# Patient Record
Sex: Female | Born: 2000 | Race: Black or African American | Hispanic: No | Marital: Single
Health system: Southern US, Community
[De-identification: ages and names within clinical notes are randomized; demographics above are authoritative.]

---

## 2019-12-12 ENCOUNTER — Emergency Department (HOSPITAL_COMMUNITY)
Admission: EM | Admit: 2019-12-12 | Discharge: 2019-12-13 | Disposition: A | Discharge status: Home / Self Care | Payer: Medicaid - Out of State | Attending: Emergency Medicine | Admitting: Emergency Medicine

## 2019-12-12 ENCOUNTER — Emergency Department (HOSPITAL_COMMUNITY): Payer: Medicaid - Out of State

## 2019-12-12 ENCOUNTER — Other Ambulatory Visit: Payer: Self-pay

## 2019-12-12 DIAGNOSIS — R0789 Other chest pain: Secondary | ICD-10-CM | POA: Insufficient documentation

## 2019-12-12 DIAGNOSIS — R079 Chest pain, unspecified: Secondary | ICD-10-CM | POA: Diagnosis present

## 2019-12-12 LAB — I-STAT BETA HCG BLOOD, ED (MC, WL, AP ONLY): I-stat hCG, quantitative: <5 m[IU]/mL (ref <5)

## 2019-12-12 LAB — I-STAT CHEM 8, ED
BUN: 19 mg/dL (ref 6–20)
Calcium, Ion: 1.17 mmol/L (ref 1.15–1.40)
Chloride: 101 mmol/L (ref 98–111)
Creatinine, Ser: 0.8 mg/dL (ref 0.44–1.00)
Glucose, Bld: 82 mg/dL (ref 70–99)
HCT: 44 % (ref 36.0–46.0)
Hemoglobin: 15 g/dL (ref 12.0–15.0)
Potassium: 3.2 mmol/L — ABNORMAL LOW (ref 3.5–5.1)
Sodium: 139 mmol/L (ref 135–145)
TCO2: 25 mmol/L (ref 22–32)

## 2019-12-12 NOTE — ED Provider Notes (Signed)
[name]-EMERGENCY DEPT Provider Note   CSN: [id number] Arrival date & time: 12/12/19  2215     History Chief Complaint  Patient presents with  . Chest Pain    Kelly Browning is a 19 y.o. female.  19 year old female presents to the emergency department for evaluation of chest pain.  She states that she has been experiencing similar chest pain intermittently over many months.  Her pain recurred this evening.  It is present in her left chest and is sharp, nonradiating.  She notices symptoms more when lying flat as well as with movement and deep breathing.  Occasionally feels winded when her pain is present.  No fevers, recent viral illness, leg swelling, N/V, syncope, OCP use, recent surgeries or hospitalizations.  Denies drug use.  No medications taken PTA.  The history is provided by the patient. No language interpreter was used.  Chest Pain      No past medical history on file.  There are no problems to display for this patient.   ** The histories are not reviewed yet. Please review them in the "History" navigator section and refresh this SmartLink.   OB History   No obstetric history on file.     No family history on file.  Social History   Tobacco Use  . Smoking status: Not on file  Substance Use Topics  . Alcohol use: Not on file  . Drug use: Not on file    Home Medications Prior to Admission medications   Medication Sig Start Date End Date Taking? Authorizing Provider  ibuprofen (ADVIL) 600 MG tablet Take 1 tablet (600 mg total) by mouth 3 (three) times daily. 12/13/19   Antony Madura, PA-C    Allergies    Patient has no known allergies.  Review of Systems   Review of Systems  Cardiovascular: Positive for chest pain.  Ten systems reviewed and are negative for acute change, except as noted in the HPI.    Physical Exam Updated Vital Signs BP (!) 155/93 (BP Location: Left Arm)   Pulse 86   Temp 99.4 F (37.4 C) (Oral)   Resp  18   Ht 5\' 1"  (1.549 m)   Wt 63.5 kg   LMP 12/12/2019   SpO2 100%   BMI 26.45 kg/m   Physical Exam Vitals and nursing note reviewed.  Constitutional:      General: She is not in acute distress.    Appearance: She is well-developed. She is not diaphoretic.     Comments: Nontoxic appearing and in NAD  HENT:     Head: Normocephalic and atraumatic.  Eyes:     General: No scleral icterus.    Conjunctiva/sclera: Conjunctivae normal.  Cardiovascular:     Rate and Rhythm: Normal rate and regular rhythm.     Pulses: Normal pulses.  Pulmonary:     Effort: Pulmonary effort is normal. No respiratory distress.     Breath sounds: No stridor. No wheezing or rales.     Comments: Lungs CTAB. Respirations even and unlabored. No TTP to chest wall. No crepitus. Musculoskeletal:        General: Normal range of motion.     Cervical back: Normal range of motion.     Comments: No BLE edema.  Skin:    General: Skin is warm and dry.     Coloration: Skin is not pale.     Findings: No erythema or rash.  Neurological:     General: No focal deficit present.  Mental Status: She is alert and oriented to person, place, and time.     Coordination: Coordination normal.  Psychiatric:        Behavior: Behavior normal.     ED Results / Procedures / Treatments   Labs (all labs ordered are listed, but only abnormal results are displayed) Labs Reviewed  I-STAT CHEM 8, ED - Abnormal; Notable for the following components:      Result Value   Potassium 3.2 (*)    All other components within normal limits  I-STAT BETA HCG BLOOD, ED ([facility], [facility], [facility] ONLY)  TROPONIN I (HIGH SENSITIVITY)    EKG ED ECG REPORT   Date: 12/13/2019  Rate: 74  Rhythm: sinus arrhythmia  QRS Axis: normal  Intervals: normal  ST/T Wave abnormalities: nonspecific ST changes  Conduction Disutrbances:none  Narrative Interpretation: Sinus arrhythmia with NSST changes V4-V6. No STEMI.  Old EKG Reviewed: none available  I have  personally reviewed the EKG tracing and agree with the computerized printout as noted.   Radiology DG Chest 2 View  Result Date: 12/12/2019 CLINICAL DATA:  Chest pain. EXAM: CHEST - 2 VIEW COMPARISON:  None. FINDINGS: The cardiomediastinal contours are normal. The lungs are clear. Pulmonary vasculature is normal. No consolidation, pleural effusion, or pneumothorax. Slight scoliotic curvature of the upper thoracic spine. No acute osseous abnormalities are seen. IMPRESSION: Other than scoliotic curvature of the upper thoracic spine, negative radiographs of the chest. Electronically Signed   By: Keith Rake M.D.   On: 12/12/2019 23:39    Procedures Procedures (including critical care time)  Medications Ordered in ED Medications - No data to display   ED Course  I have reviewed the triage vital signs and the nursing notes.  Pertinent labs & imaging results that were available during my care of the patient were reviewed by me and considered in my medical decision making (see chart for details).    MDM Rules/Calculators/A&P                      Patient presents to the emergency department for evaluation of chest pain.  Pain has been chronic, intermittent. EKG is reassuring and troponin negative.  Chest x-ray without evidence of mediastinal widening to suggest dissection.  No pneumothorax, pneumonia, pleural effusion.  Pulmonary embolus further considered; however, patient without tachycardia, tachypnea, dyspnea, hypoxia.  Patient is PERC negative.  Question of musculoskeletal etiology as pain is aggravated with breathing as well as movement.  While she reports some discomfort when lying flat, pain is not alleviated when leaning forward.  Her history and symptom chronicity is slightly atypical for pericarditis.  She has no diffuse ST elevation on EKG.  Still, will plan to manage as outpatient with consistent use of NSAIDs.  Patient encouraged to follow-up with her primary care doctor.   Return precautions discussed and provided. Patient discharged in stable condition with no unaddressed concerns.   Final Clinical Impression(s) / ED Diagnoses Final diagnoses:  Nonspecific chest pain    Rx / DC Orders ED Discharge Orders         Ordered    ibuprofen (ADVIL) 600 MG tablet  3 times daily     12/13/19 0051           Antonietta Breach, PA-C 12/13/19 0100    Valarie Merino, MD 12/15/19 1340

## 2019-12-12 NOTE — ED Notes (Signed)
EKG given to EDP,Rancour,MD., for review. 

## 2019-12-12 NOTE — ED Triage Notes (Signed)
Patient came to ED due to chest pain that started today has a history of murmur

## 2019-12-13 ENCOUNTER — Other Ambulatory Visit: Payer: Self-pay

## 2019-12-13 LAB — TROPONIN I (HIGH SENSITIVITY): Troponin I (High Sensitivity): 3 ng/L (ref <18)

## 2019-12-13 MED ORDER — IBUPROFEN 600 MG PO TABS: 600.0000 mg | ORAL_TABLET | Freq: Three times a day (TID) | ORAL | 0 refills | Status: AC

## 2019-12-13 NOTE — ED Notes (Signed)
EKG trouble shoot resolved, now showingin epic

## 2019-12-13 NOTE — Discharge Instructions (Addendum)
Your work-up in the emergency department tonight was reassuring and did not reveal a concerning cause of your chest pain.  We recommend follow-up with your primary care doctor for evaluation of ongoing discomfort.  You may return for any new or concerning symptoms.

## 2021-01-24 IMAGING — CR DG CHEST 2V
2 series · 2 of 2 positions shown · non-contrast
Comparison: None.

CLINICAL DATA: Chest pain.

EXAM:
CHEST - 2 VIEW

[w chest pa]
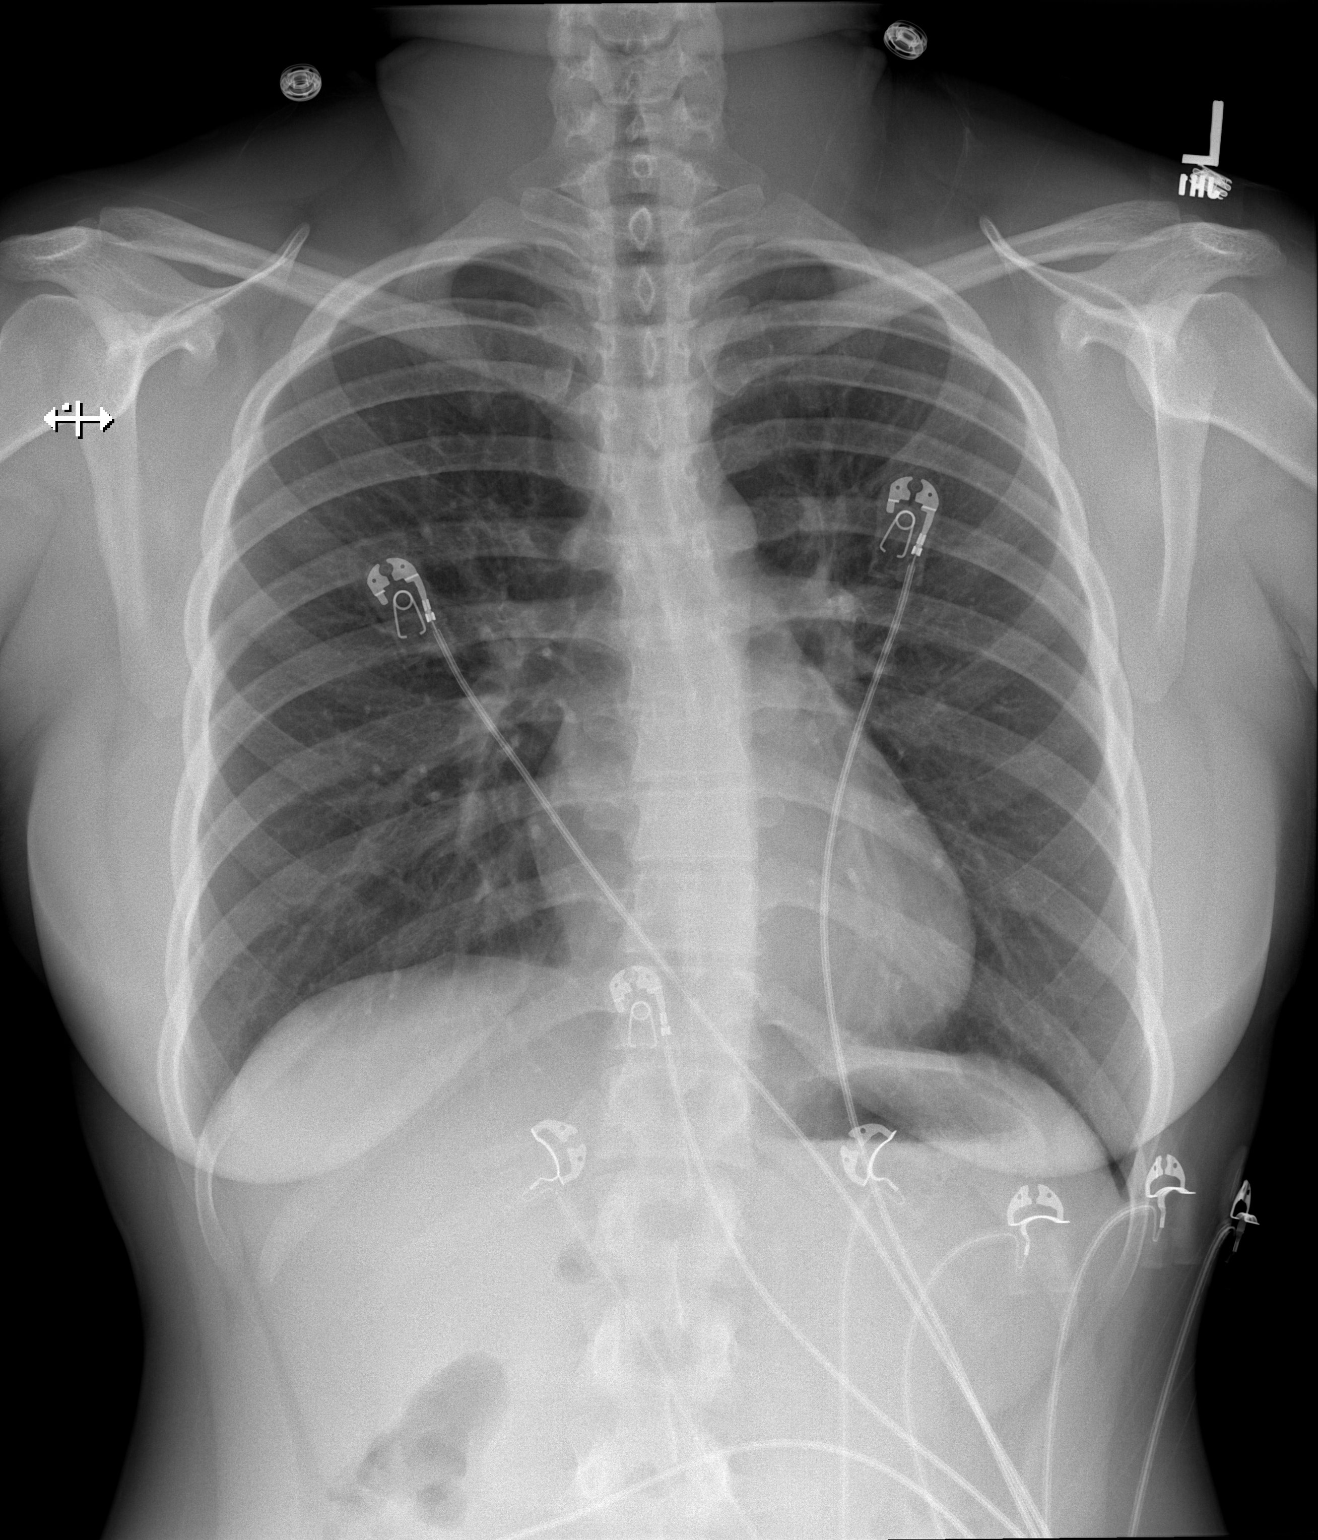

[w chest lat]
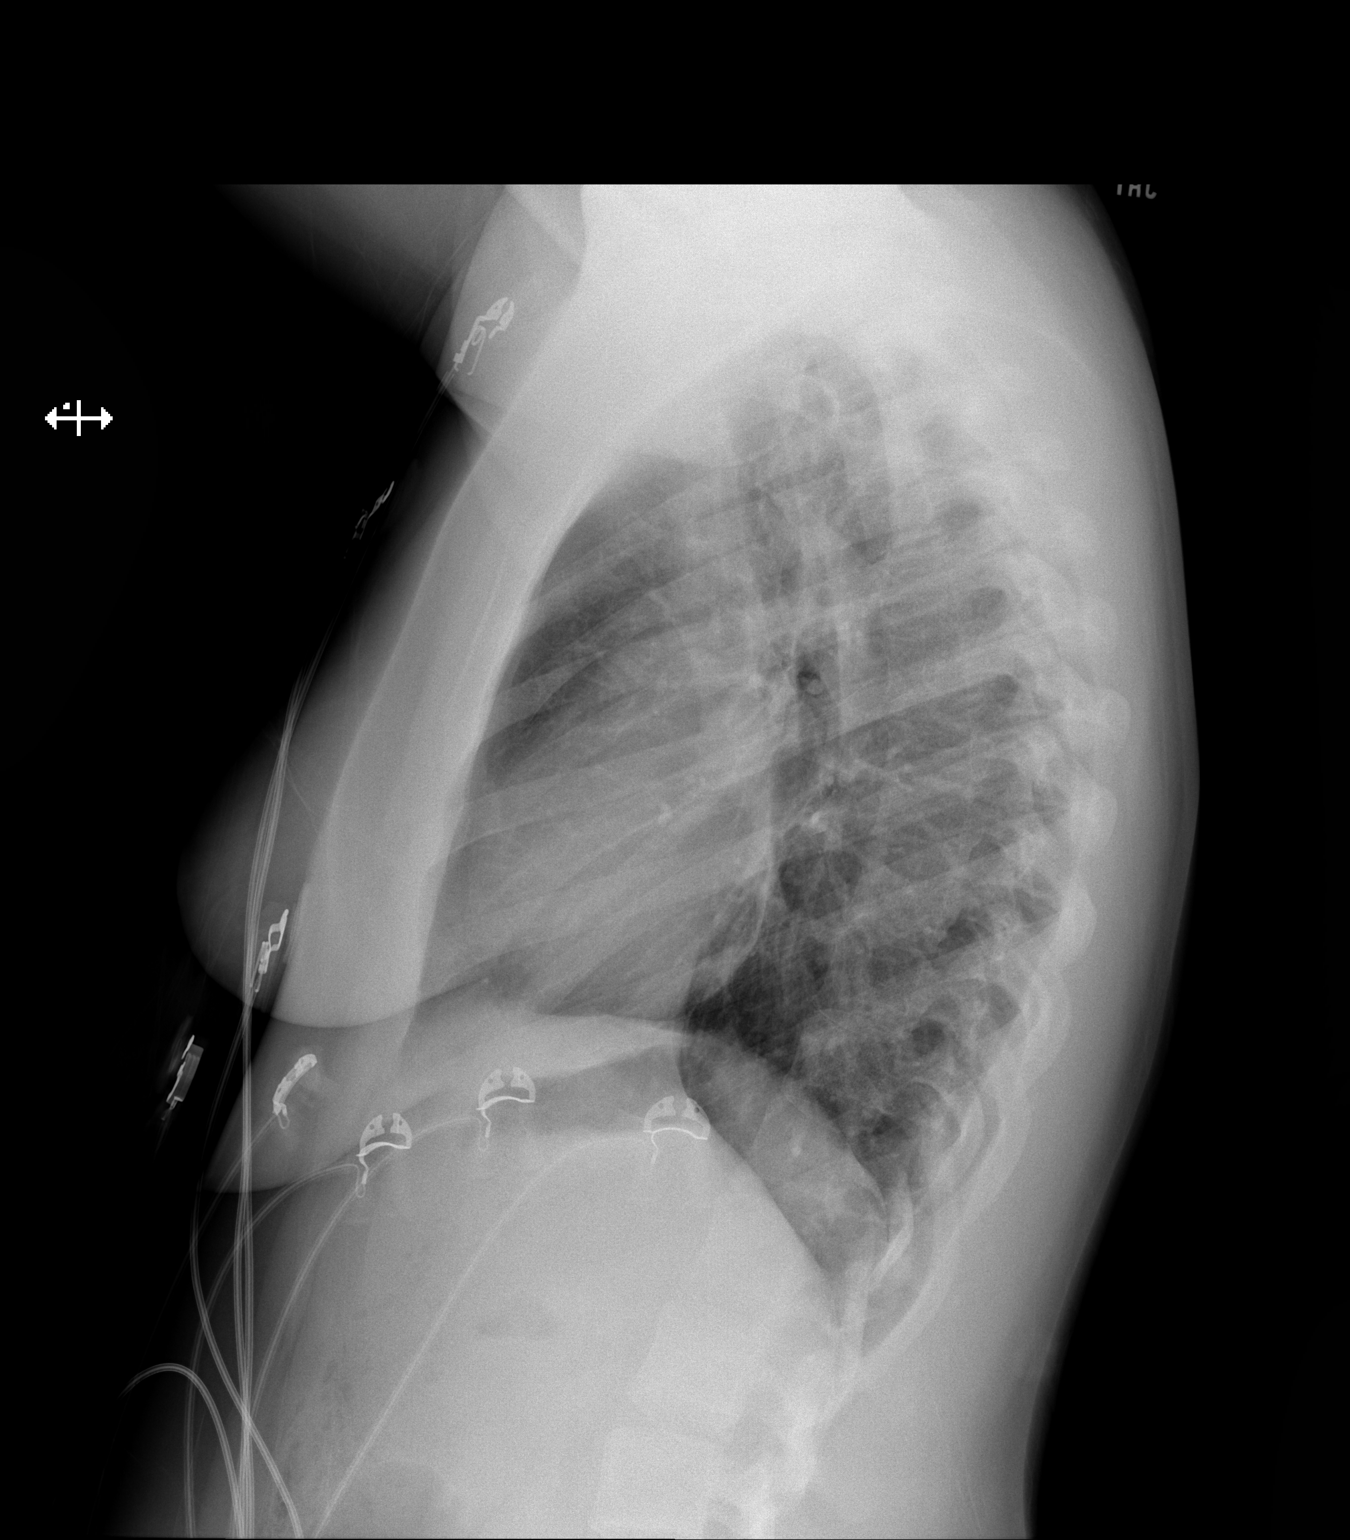

[2 of 2 positions shown; findings below may reference images not displayed]

FINDINGS: The cardiomediastinal contours are normal. The lungs are clear.
Pulmonary vasculature is normal. No consolidation, pleural effusion,
or pneumothorax. Slight scoliotic curvature of the upper thoracic
spine. No acute osseous abnormalities are seen.
IMPRESSION: Other than scoliotic curvature of the upper thoracic spine, negative
radiographs of the chest.
